# Patient Record
Sex: Female | Born: 2005 | Race: Black or African American | Marital: Single
Health system: Northeastern US, Academic
[De-identification: ages and names within clinical notes are randomized; demographics above are authoritative.]

## PROBLEM LIST (undated history)

## (undated) DIAGNOSIS — Z789 Other specified health status: Secondary | ICD-10-CM

## (undated) DIAGNOSIS — O26619 Liver and biliary tract disorders in pregnancy, unspecified trimester: Secondary | ICD-10-CM

## (undated) DIAGNOSIS — D649 Anemia, unspecified: Secondary | ICD-10-CM

## (undated) DIAGNOSIS — O26649 Intrahepatic cholestasis of pregnancy, unspecified trimester: Secondary | ICD-10-CM

## (undated) DIAGNOSIS — K831 Obstruction of bile duct: Secondary | ICD-10-CM

## (undated) HISTORY — DX: Liver and biliary tract disorders in pregnancy, unspecified trimester: O26.619

## (undated) HISTORY — PX: NO PAST SURGERIES: SHX2092

## (undated) HISTORY — DX: Anemia, unspecified: D64.9

## (undated) HISTORY — DX: Intrahepatic cholestasis of pregnancy, unspecified trimester: O26.649

## (undated) HISTORY — DX: Obstruction of bile duct: K83.1

---
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
## 2024-03-16 LAB — UNMAPPED LAB RESULTS
HIV 1&2 ANTIGEN/ANTIBODY (HT): NONREACTIVE
Hematocrit (HT): 39 % (ref 35–47)
Hemoglobin (HGB) (HT): 12.6 g/dL (ref 12.0–16.0)
MCHC (HT): 32.6 g/dL (ref 31.0–37.5)
MCV (HT): 89 fL (ref 80–100)
Mean Corpuscular Hemoglobin (MCH) (HT): 28.9 pg (ref 26.0–34.0)
Platelets (HT): 234 10 3/uL (ref 150–450)
RBC (HT): 4.36 10 6/uL (ref 3.80–5.20)
RDW (HT): 12.7 % (ref 0.0–15.2)
WBC (HT): 7 10 3/uL (ref 4.5–13.0)

## 2024-03-17 LAB — UNMAPPED LAB RESULTS
Hep C Ab: NEGATIVE
VZV IgG (HT): 3.2 AI

## 2024-03-17 LAB — TREPONEMA PALLIDUM (SYPHILIS) ANTIBODY (HT): Syphilis Treponemal Ab (HT): NONREACTIVE

## 2024-04-04 ENCOUNTER — Ambulatory Visit: Admitting: Otolaryngology

## 2024-04-04 NOTE — Progress Notes (Deleted)
 Reason for Visit  ***    HPI  Leydi is a 18 y.o. female who presents with ***         No data to display               Nasal Symptom Inventory  Facial or sinus pressure:      Facial or sinus pain:      Headache:      Nasal congestion:      Nasal obstruction:      Post-nasal drip:      Clear nasal discharge:       Discolored nasal discharge:      Itchy nose/eyes/throat:      Nose bleeds:     Tiredness:      Wheezing:      Cough:        Allergies  She has No Known Allergies (drug, envir, food or latex).    PMH  She has no past medical history on file.    PSH  She has no past surgical history on file.    FH  Her family history is not on file.    SH  She  The patient ***.    PE  Vitals There were no vitals taken for this visit.   General General appearance normal. Alert and cooperative. Voice normal.   Head/Face Normocephalic; atraumatic. No facial skin lesions. Facial strength symmetric.   Eyes Extraocular muscles intact. No nystagmus with lateral gaze.   Ears, Nose, Mouth & Throat Normal EAC. Normal TM appearance. Normal TM mobility.  Bilateral AC>BC @ 512 Hz. Weber midline. Clinical speech thresholds normal.   External ears normal. External nose normal. Nasal mucosa normal. Septum midline. Inferior turbinates {[place] turbinates:44780}. No polyps. No nasal discharge.  Lips, teeth, and gums normal. Oral mucosa moist, without lesions. Tongue and FOM without masses. Palate and uvula without lesions and with symmetric elevation. Tonsils symmetric without lesions. Oropharynx clear.   Neck No masses. No palpable cervical adenopathy.   Respiratory Normal respiratory effort. No retractions.   Cardiovascular Upper extremities well-perfused.   Neuro/Psych CNII-XII grossly normal. Alert and oriented. Mood and affect normal.     Results  ***    Assessment  ***  ***    Plan  ***  No follow-ups on file.
